# Patient Record
Sex: Female | Born: 1940 | Hispanic: No | Marital: Married | Smoking: Never smoker
Health system: Southern US, Community
[De-identification: ages and names within clinical notes are randomized; demographics above are authoritative.]

---

## 2004-08-01 ENCOUNTER — Ambulatory Visit: Payer: Self-pay | Admitting: Internal Medicine

## 2008-11-04 ENCOUNTER — Ambulatory Visit: Payer: Self-pay | Admitting: Gastroenterology

## 2013-06-06 ENCOUNTER — Ambulatory Visit: Payer: Medicare Other | Admitting: Podiatry

## 2014-02-16 ENCOUNTER — Encounter: Payer: Self-pay | Admitting: Podiatry

## 2014-02-16 ENCOUNTER — Ambulatory Visit (INDEPENDENT_AMBULATORY_CARE_PROVIDER_SITE_OTHER): Payer: Medicare Other

## 2014-02-16 ENCOUNTER — Ambulatory Visit (INDEPENDENT_AMBULATORY_CARE_PROVIDER_SITE_OTHER): Payer: Medicare Other | Admitting: Podiatry

## 2014-02-16 VITALS — BP 136/78 | HR 60 | Resp 16 | Ht 65.0 in | Wt 180.0 lb

## 2014-02-16 DIAGNOSIS — M779 Enthesopathy, unspecified: Secondary | ICD-10-CM | POA: Diagnosis not present

## 2014-02-16 DIAGNOSIS — M722 Plantar fascial fibromatosis: Secondary | ICD-10-CM

## 2014-02-16 NOTE — Progress Notes (Signed)
   Subjective:    Patient ID: Bailey Green, female    DOB: 01/10/1941, 74 y.o.   MRN: [id number]  HPI Comments: She has pain in both feet. Both of her feet hurt all over. This has been going on for 4 - 5 yrs. Its getting worse. It hurts to walk and stand. She has had physical therapy for her feet in [place] last year and wears special shoes.  Foot Pain Associated symptoms include numbness.      Review of Systems  Genitourinary: Positive for frequency.  Musculoskeletal: Positive for back pain.       Joint pain Difficulty walking   Neurological: Positive for numbness.  All other systems reviewed and are negative.      Objective:   Physical Exam: I have reviewed her past medical history medications allergies surgery social history and review of symptoms. Pulses are strongly palpable bilateral. Neurologic sensorium is intact per [employer] monofilament. Deep tendon reflexes are intact bilateral muscle strength is 5 over 5 dorsiflexion plantar flexors and inverters everters all just musculature is intact. Orthopedic evaluation demonstrates hallux valgus deformities and mild hammertoe deformities are noted bilateral. She has pain on palpation medially located tubercle of the left heel with radiating pain along the peroneals to the level of the left knee where she has recently had injections. Radiographic evaluation confirms soft tissue increase in density at the plantar fascial calcaneal insertion site indicative of plantar fasciitis with pain on palpation medially located tubercle of the left heel. Mild hallux valgus deformity is noted bilateral.        Assessment & Plan:  Assessment: Plantar fasciitis left hallux abductovalgus deformity bilateral.  Plan: Discussed etiology pathology conservative versus surgical therapies. Injected the left till today and put her in a plantar fascial strapping we will follow up with Dr. office and consider diabetic shoes if she is indeed  truly a diabetic. Currently she is on no medication and is not being treated for diabetes according to the patient and her family member who provides her history.

## 2015-12-28 ENCOUNTER — Other Ambulatory Visit: Payer: Self-pay | Admitting: Internal Medicine

## 2015-12-28 DIAGNOSIS — N6310 Unspecified lump in the right breast, unspecified quadrant: Secondary | ICD-10-CM

## 2015-12-28 DIAGNOSIS — N289 Disorder of kidney and ureter, unspecified: Secondary | ICD-10-CM

## 2015-12-29 ENCOUNTER — Inpatient Hospital Stay
Admission: RE | Admit: 2015-12-29 | Discharge: 2015-12-29 | Disposition: A | Discharge status: Home / Self Care | Payer: Self-pay | Source: Ambulatory Visit | Attending: *Deleted | Admitting: *Deleted

## 2015-12-29 ENCOUNTER — Other Ambulatory Visit: Payer: Self-pay | Admitting: *Deleted

## 2015-12-29 DIAGNOSIS — Z9289 Personal history of other medical treatment: Secondary | ICD-10-CM

## 2016-01-04 ENCOUNTER — Ambulatory Visit
Admission: RE | Admit: 2016-01-04 | Discharge: 2016-01-04 | Disposition: A | Discharge status: Home / Self Care | Payer: Medicare Other | Source: Ambulatory Visit | Attending: Internal Medicine | Admitting: Internal Medicine

## 2016-01-04 DIAGNOSIS — N289 Disorder of kidney and ureter, unspecified: Secondary | ICD-10-CM | POA: Diagnosis present

## 2016-01-04 DIAGNOSIS — Q638 Other specified congenital malformations of kidney: Secondary | ICD-10-CM | POA: Insufficient documentation

## 2016-01-04 DIAGNOSIS — N281 Cyst of kidney, acquired: Secondary | ICD-10-CM | POA: Diagnosis not present

## 2016-01-18 ENCOUNTER — Encounter: Payer: Self-pay | Admitting: Radiology

## 2016-01-18 ENCOUNTER — Ambulatory Visit
Admission: RE | Admit: 2016-01-18 | Discharge: 2016-01-18 | Disposition: A | Discharge status: Home / Self Care | Payer: Medicare Other | Source: Ambulatory Visit | Attending: Internal Medicine | Admitting: Internal Medicine

## 2016-01-18 DIAGNOSIS — N6310 Unspecified lump in the right breast, unspecified quadrant: Secondary | ICD-10-CM | POA: Diagnosis not present

## 2017-08-19 IMAGING — US US RENAL
1 series · 14 of 25 positions shown · non-contrast
Comparison: No recent prior .

CLINICAL DATA: Renal insufficiency.

EXAM:
RENAL / URINARY TRACT ULTRASOUND COMPLETE

[Series 1: us renal · 0.23mm/px · 14 of 39 slices shown]
[im 1/39]
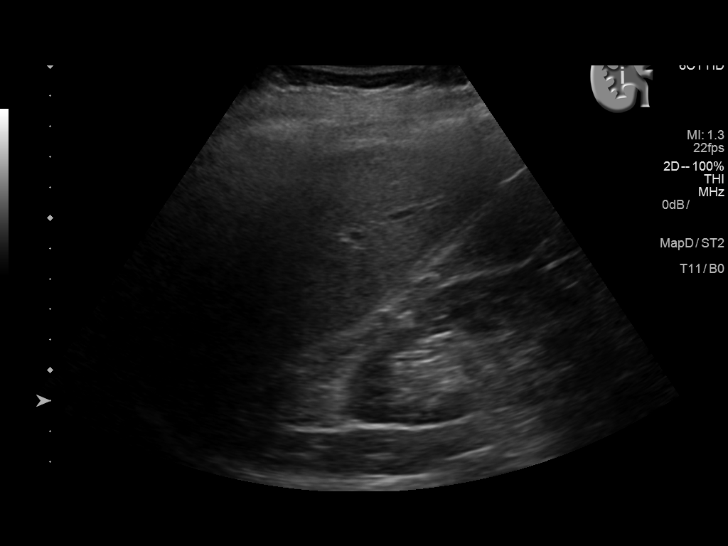
[im 4/39]
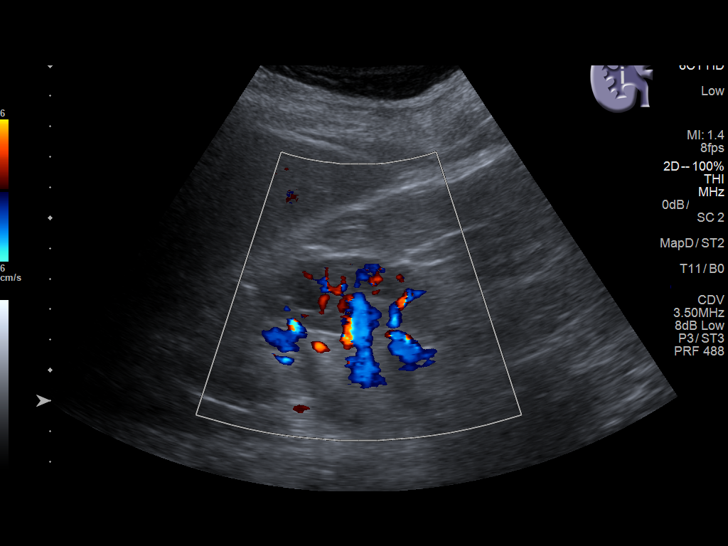
[im 7/39]
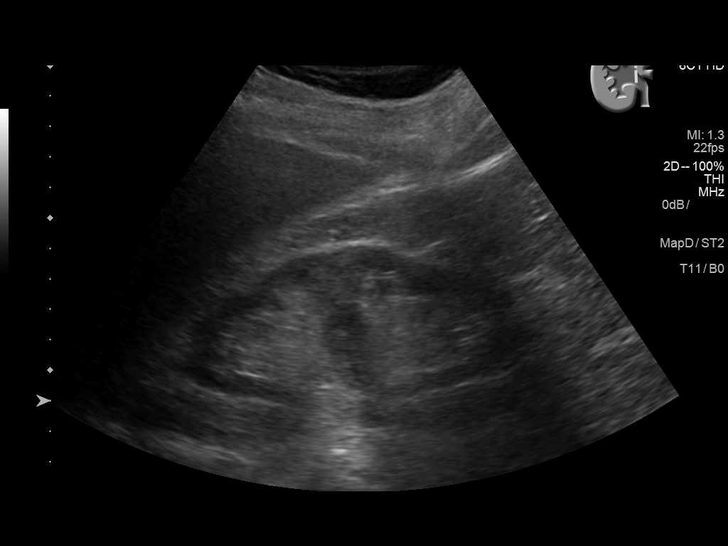
[im 10/39]
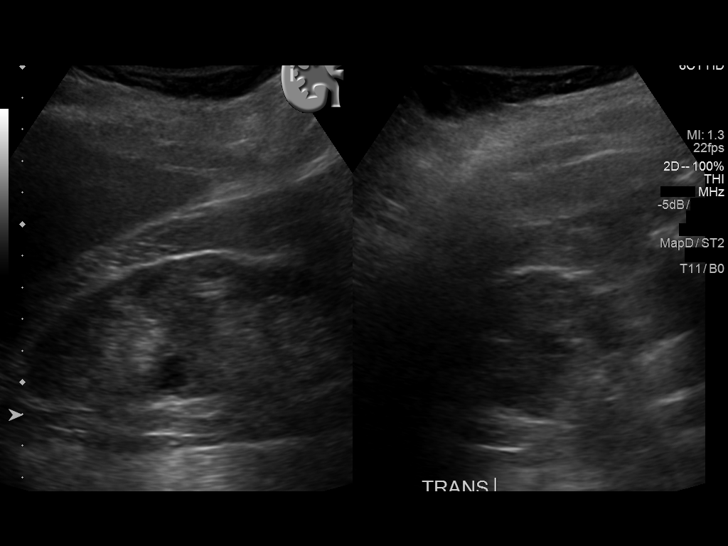
[im 13/39]
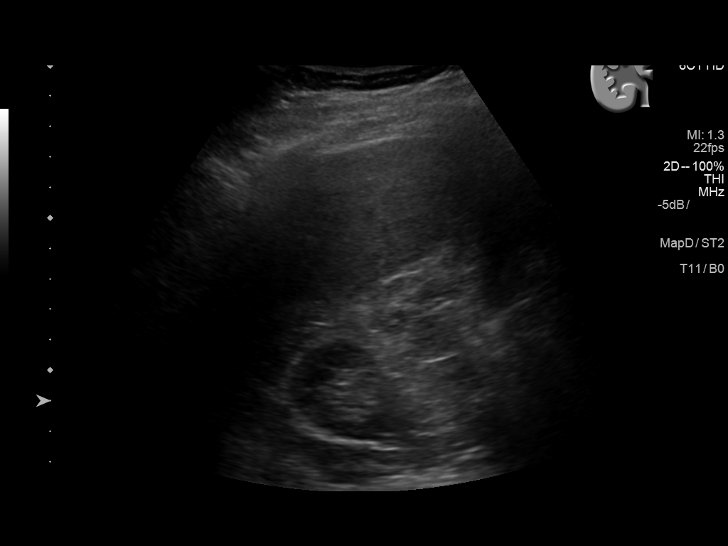
[im 15/39]
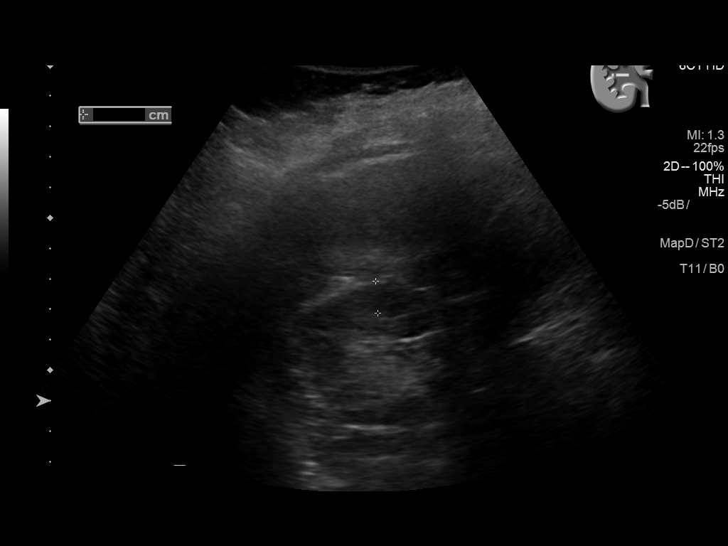
[im 18/39]
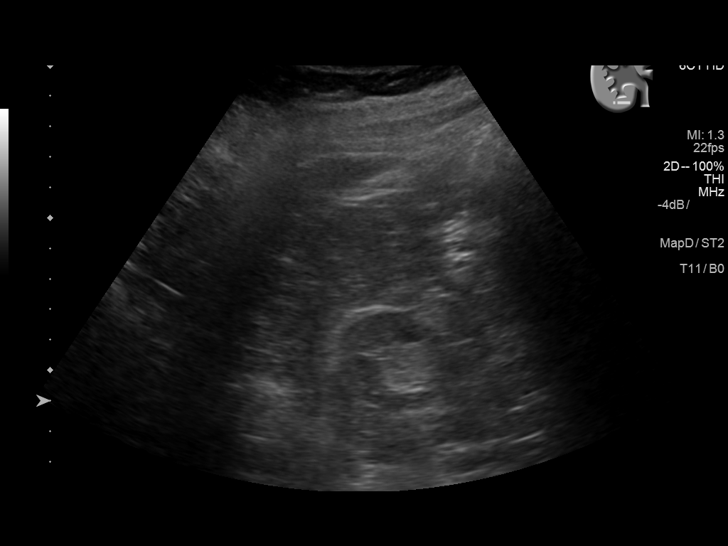
[im 21/39]
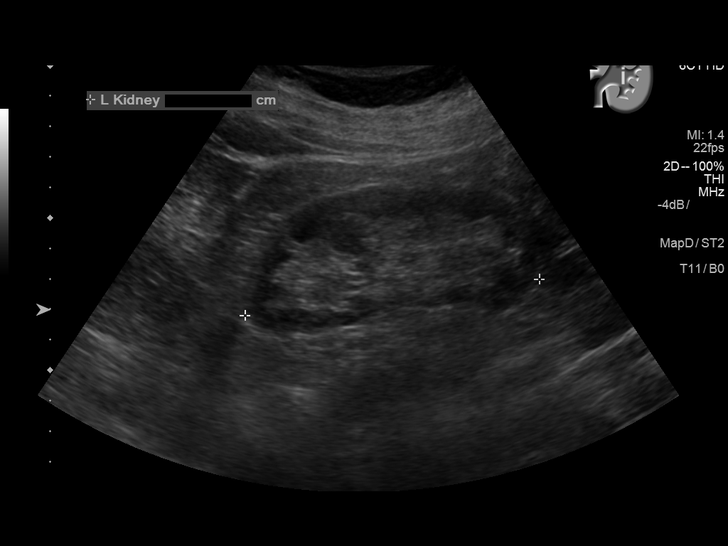
[im 24/39]
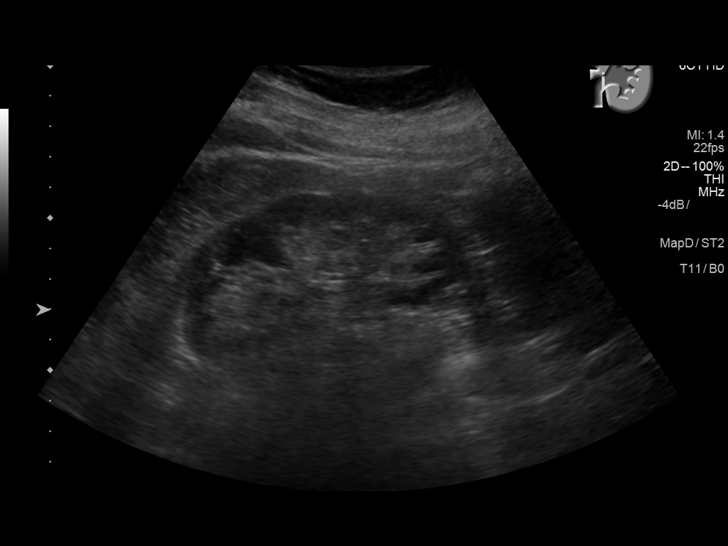
[im 26/39]
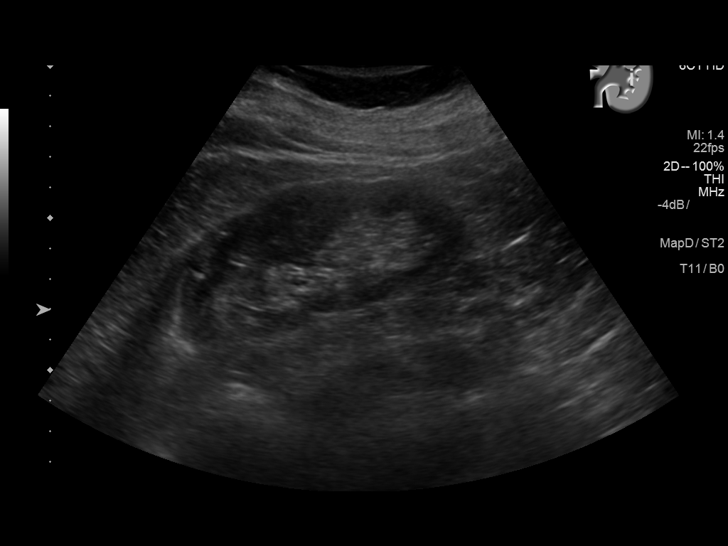
[im 29/39]
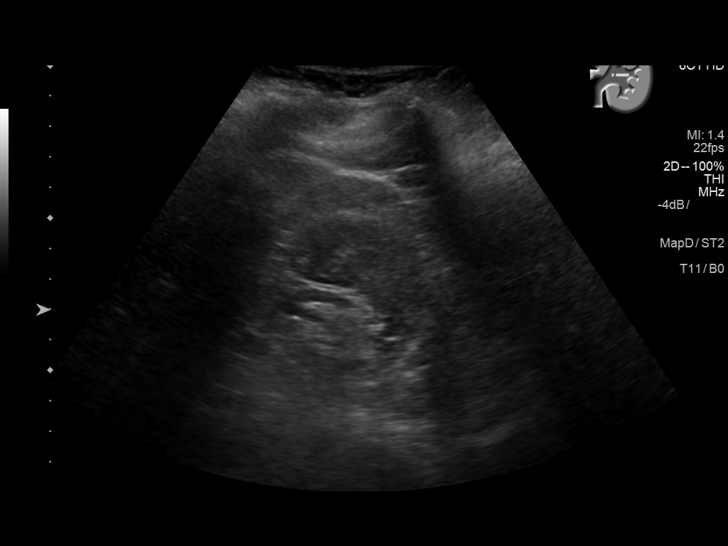
[im 32/39]
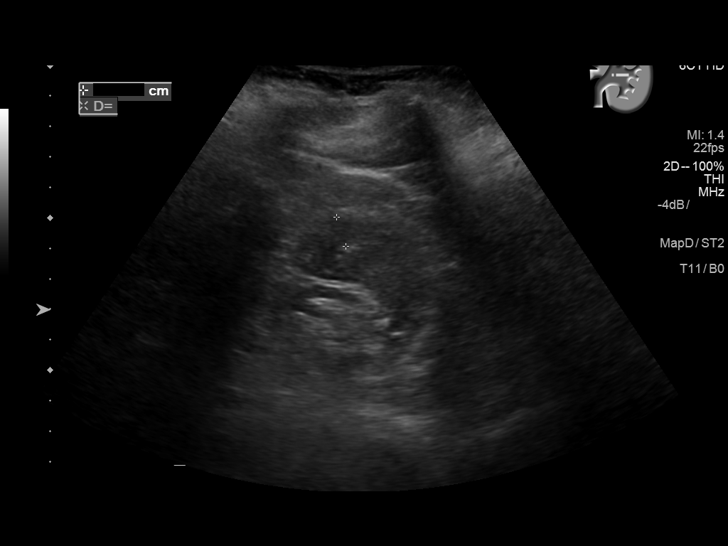
[im 35/39]
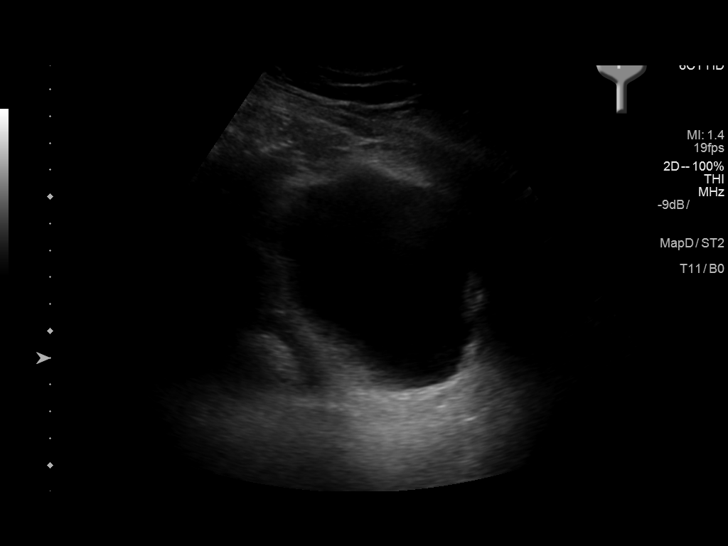
[im 39/39]
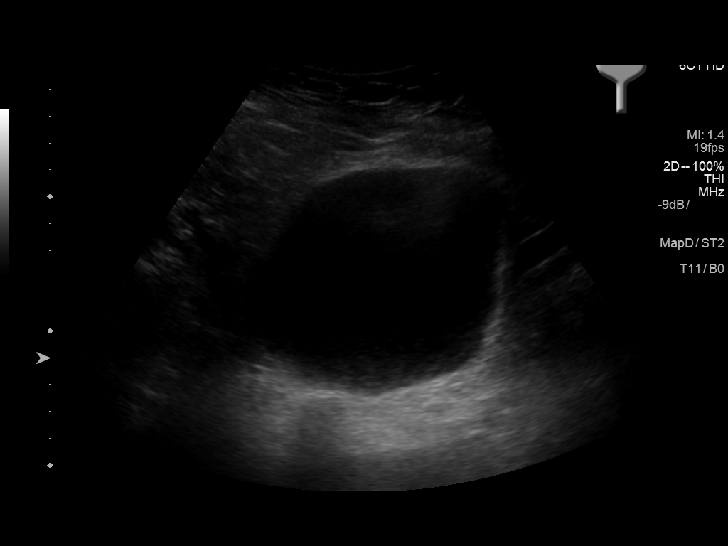

[14 of 25 positions shown; findings below may reference images not displayed]

FINDINGS: Right Kidney:

Length: 11.9 cm. Renal cortical thinning. Duplicated collecting
system may be present. 1.1 cm simple cyst. No hydronephrosis.

Left Kidney:

Length: 9.7 cm. Renal cortical thinning. Left kidney is smaller than
right consistent with atrophy. No mass lesion or hydronephrosis.

Bladder:

Appears normal for degree of bladder distention.
IMPRESSION: 1. Bilateral renal cortical thinning/atrophy. Duplicated collecting
system on the right may be present. No acute abnormality. No
hydronephrosis. No bladder distention.

2.  1.1 cm simple cyst right kidney.
